# Patient Record
Sex: Male | Born: 1975 | Race: White | Hispanic: No | Marital: Single | Smoking: Former smoker
Health system: Southern US, Community
[De-identification: ages and names within clinical notes are randomized; demographics above are authoritative.]

---

## 2017-07-25 ENCOUNTER — Other Ambulatory Visit: Payer: Self-pay

## 2017-07-25 ENCOUNTER — Emergency Department (HOSPITAL_COMMUNITY): Payer: Self-pay

## 2017-07-25 ENCOUNTER — Emergency Department (HOSPITAL_COMMUNITY)
Admission: EM | Admit: 2017-07-25 | Discharge: 2017-07-26 | Disposition: A | Discharge status: Home / Self Care | Payer: Self-pay | Attending: Emergency Medicine | Admitting: Emergency Medicine

## 2017-07-25 DIAGNOSIS — R079 Chest pain, unspecified: Secondary | ICD-10-CM | POA: Insufficient documentation

## 2017-07-25 DIAGNOSIS — Z5321 Procedure and treatment not carried out due to patient leaving prior to being seen by health care provider: Secondary | ICD-10-CM | POA: Insufficient documentation

## 2017-07-25 LAB — BASIC METABOLIC PANEL
Anion gap: 9 (ref 5–15)
BUN: 15 mg/dL (ref 6–20)
CHLORIDE: 107 mmol/L (ref 98–111)
CO2: 23 mmol/L (ref 22–32)
Calcium: 9.1 mg/dL (ref 8.9–10.3)
Creatinine, Ser: 1.26 mg/dL — ABNORMAL HIGH (ref 0.61–1.24)
GFR calc Af Amer: >60 mL/min (ref >60)
GFR calc non Af Amer: >60 mL/min (ref >60)
GLUCOSE: 99 mg/dL (ref 70–99)
POTASSIUM: 3.7 mmol/L (ref 3.5–5.1)
Sodium: 139 mmol/L (ref 135–145)

## 2017-07-25 LAB — CBC
HEMATOCRIT: 45 % (ref 39.0–52.0)
Hemoglobin: 15.3 g/dL (ref 13.0–17.0)
MCH: 30.1 pg (ref 26.0–34.0)
MCHC: 34 g/dL (ref 30.0–36.0)
MCV: 88.6 fL (ref 78.0–100.0)
Platelets: 313 10*3/uL (ref 150–400)
RBC: 5.08 MIL/uL (ref 4.22–5.81)
RDW: 12.1 % (ref 11.5–15.5)
WBC: 8.7 10*3/uL (ref 4.0–10.5)

## 2017-07-25 LAB — I-STAT TROPONIN, ED: Troponin i, poc: 0 ng/mL (ref 0.00–0.08)

## 2017-07-25 NOTE — ED Triage Notes (Signed)
Patient c/o CP and SOB. Has been ongoing for a while, worsening today-  states that if feels like palpitations. Patient is hyperventilating intermittently and appears anxious.

## 2017-07-26 ENCOUNTER — Other Ambulatory Visit: Payer: Self-pay

## 2017-07-26 ENCOUNTER — Emergency Department (HOSPITAL_COMMUNITY)
Admission: EM | Admit: 2017-07-26 | Discharge: 2017-07-27 | Disposition: A | Discharge status: Home / Self Care | Payer: Self-pay | Attending: Emergency Medicine | Admitting: Emergency Medicine

## 2017-07-26 ENCOUNTER — Encounter (HOSPITAL_COMMUNITY): Payer: Self-pay | Admitting: Emergency Medicine

## 2017-07-26 ENCOUNTER — Emergency Department (HOSPITAL_COMMUNITY): Payer: Self-pay

## 2017-07-26 DIAGNOSIS — R002 Palpitations: Secondary | ICD-10-CM | POA: Insufficient documentation

## 2017-07-26 DIAGNOSIS — Z87891 Personal history of nicotine dependence: Secondary | ICD-10-CM | POA: Insufficient documentation

## 2017-07-26 NOTE — ED Notes (Signed)
PT did not respond when called for vitals X1

## 2017-07-26 NOTE — ED Notes (Signed)
Pt did not respond when called X2

## 2017-07-26 NOTE — ED Notes (Signed)
Patient was reported leaving by others in the lobby.

## 2017-07-26 NOTE — ED Notes (Signed)
Follow up call made  Pt states he is going to return to ed  07/26/17/1505  s Kaidyn Javid rn

## 2017-07-26 NOTE — ED Triage Notes (Signed)
Pt reports heart palpitations that started yesterday accompanied with chest heaviness. Pt denies pain.

## 2017-07-27 LAB — CBC
HCT: 44.9 % (ref 39.0–52.0)
HEMOGLOBIN: 14.9 g/dL (ref 13.0–17.0)
MCH: 29.9 pg (ref 26.0–34.0)
MCHC: 33.2 g/dL (ref 30.0–36.0)
MCV: 90.2 fL (ref 78.0–100.0)
PLATELETS: 286 10*3/uL (ref 150–400)
RBC: 4.98 MIL/uL (ref 4.22–5.81)
RDW: 12.4 % (ref 11.5–15.5)
WBC: 9.1 10*3/uL (ref 4.0–10.5)

## 2017-07-27 LAB — BASIC METABOLIC PANEL
Anion gap: 8 (ref 5–15)
BUN: 16 mg/dL (ref 6–20)
CALCIUM: 9.4 mg/dL (ref 8.9–10.3)
CO2: 27 mmol/L (ref 22–32)
CREATININE: 1.24 mg/dL (ref 0.61–1.24)
Chloride: 107 mmol/L (ref 98–111)
GFR calc Af Amer: >60 mL/min (ref >60)
GFR calc non Af Amer: >60 mL/min (ref >60)
GLUCOSE: 89 mg/dL (ref 70–99)
Potassium: 3.9 mmol/L (ref 3.5–5.1)
Sodium: 142 mmol/L (ref 135–145)

## 2017-07-27 LAB — I-STAT TROPONIN, ED: Troponin i, poc: 0 ng/mL (ref 0.00–0.08)

## 2017-07-27 NOTE — Discharge Instructions (Addendum)
Follow up with [facility] for consideration of a 24-hour monitor to further evaluate your symptoms of skipping beats. It is also encouraged to find a Primary care provider for routine health. Use the Resource list for a primary care if this is helpful. Return to the emergency department if you experience worsening symptoms or new concerns.

## 2017-07-27 NOTE — ED Provider Notes (Signed)
[facility] Provider Note   CSN: [id number] Arrival date & time: 07/26/17  2317     History   Chief Complaint Chief Complaint  Patient presents with  . Palpitations    HPI Victor Villa is a 42 y.o. male.  Patient presents with complaint of palpitations that are described as skipping beats. He states it feels as though his heart skips and then feels a rush of adrenaline that makes him nervous. He states he has had these infrequently for years that have become significantly more frequent over the last several days. He denies chest pain, sense of heart racing, diaphoresis, SOB, fever, cough. He feels that it sometimes sends him into a full blown panic attack. He reports being under significant stress.   The history is provided by the patient. No language interpreter was used.  Palpitations   Pertinent negatives include no fever, no chest pain and no shortness of breath.    History reviewed. No pertinent past medical history.  There are no active problems to display for this patient.   History reviewed. No pertinent surgical history.      Home Medications    Prior to Admission medications   Not on File    Family History No family history on file.  Social History Social History   Tobacco Use  . Smoking status: Former Smoker    Types: Cigarettes    Last attempt to quit: 02/26/2017    Years since quitting: 0.4  . Smokeless tobacco: Never Used  Substance Use Topics  . Alcohol use: Never    Frequency: Never  . Drug use: Never     Allergies   Patient has no known allergies.   Review of Systems Review of Systems  Constitutional: Negative for chills and fever.  HENT: Negative.   Respiratory: Negative.  Negative for shortness of breath.   Cardiovascular: Positive for palpitations. Negative for chest pain.  Gastrointestinal: Negative.   Musculoskeletal: Negative.  Negative for myalgias.  Skin: Negative.     Neurological: Negative.   Psychiatric/Behavioral: The patient is nervous/anxious.      Physical Exam Updated Vital Signs BP (!) 107/57   Pulse 64   Temp 97.8 F (36.6 C) (Oral)   Resp (!) 21   Ht 6\' 2"  (1.88 m)   Wt 99.8 kg (220 lb)   SpO2 100%   BMI 28.25 kg/m   Physical Exam  Constitutional: He is oriented to person, place, and time. He appears well-developed and well-nourished.  HENT:  Head: Normocephalic.  Neck: Normal range of motion. Neck supple. Carotid bruit is not present.  Cardiovascular: Normal rate and regular rhythm.  No murmur heard. Pulmonary/Chest: Effort normal and breath sounds normal. He has no wheezes. He has no rales.  Abdominal: Soft. Bowel sounds are normal. There is no tenderness. There is no rebound and no guarding.  Musculoskeletal: Normal range of motion. He exhibits no edema.  Neurological: He is alert and oriented to person, place, and time.  Skin: Skin is warm and dry. No rash noted.  Psychiatric: He has a normal mood and affect.  Nursing note and vitals reviewed.    ED Treatments / Results  Labs (all labs ordered are listed, but only abnormal results are displayed) Labs Reviewed  BASIC METABOLIC PANEL  CBC  I-STAT TROPONIN, ED    EKG None  Radiology Dg Chest 2 View  Result Date: 07/25/2017 CLINICAL DATA:  Patient c/o CP and SOB. Has been ongoing for a  while, worsening today- states that if feels like palpitations. Patient is hyperventilating intermittently and appears anxious. EXAM: CHEST - 2 VIEW COMPARISON:  None. FINDINGS: The heart size and mediastinal contours are within normal limits. Both lungs are clear. The visualized skeletal structures are unremarkable. IMPRESSION: No active cardiopulmonary disease. Electronically Signed   By: Gaylyn Rong M.D.   On: 07/25/2017 23:33    Procedures Procedures (including critical care time)  Medications Ordered in ED Medications - No data to display   Initial Impression /  Assessment and Plan / ED Course  I have reviewed the triage vital signs and the nursing notes.  Pertinent labs & imaging results that were available during my care of the patient were reviewed by me and considered in my medical decision making (see chart for details).     Patient is here for frequent palpitations described as skipping beats, which causes anxiety and sometimes panic. Labs, CXR, EKG all unremarkable.   There are infrequent PVC's on the monitor that coincide with when patient feels symptoms.   VSS. He is well appearing. No other medical history. Will refer to cardiology for consideration of holter monitor. Strongly encouraged establishing with a PCP for routine health.  Final Clinical Impressions(s) / ED Diagnoses   Final diagnoses:  None   1. palpitations  ED Discharge Orders    None       Elpidio Anis, PA-C 07/27/17 0258    Gilda Crease, MD 07/28/17 [phone]

## 2019-08-07 IMAGING — DX DG CHEST 2V
2 series · 2 of 2 positions shown · non-contrast
Comparison: None.

CLINICAL DATA: Patient c/o CP and SOB. Has been ongoing for a
while, worsening today- states that if feels like palpitations.
Patient is hyperventilating intermittently and appears anxious.

EXAM:
CHEST - 2 VIEW

[w chest pa]
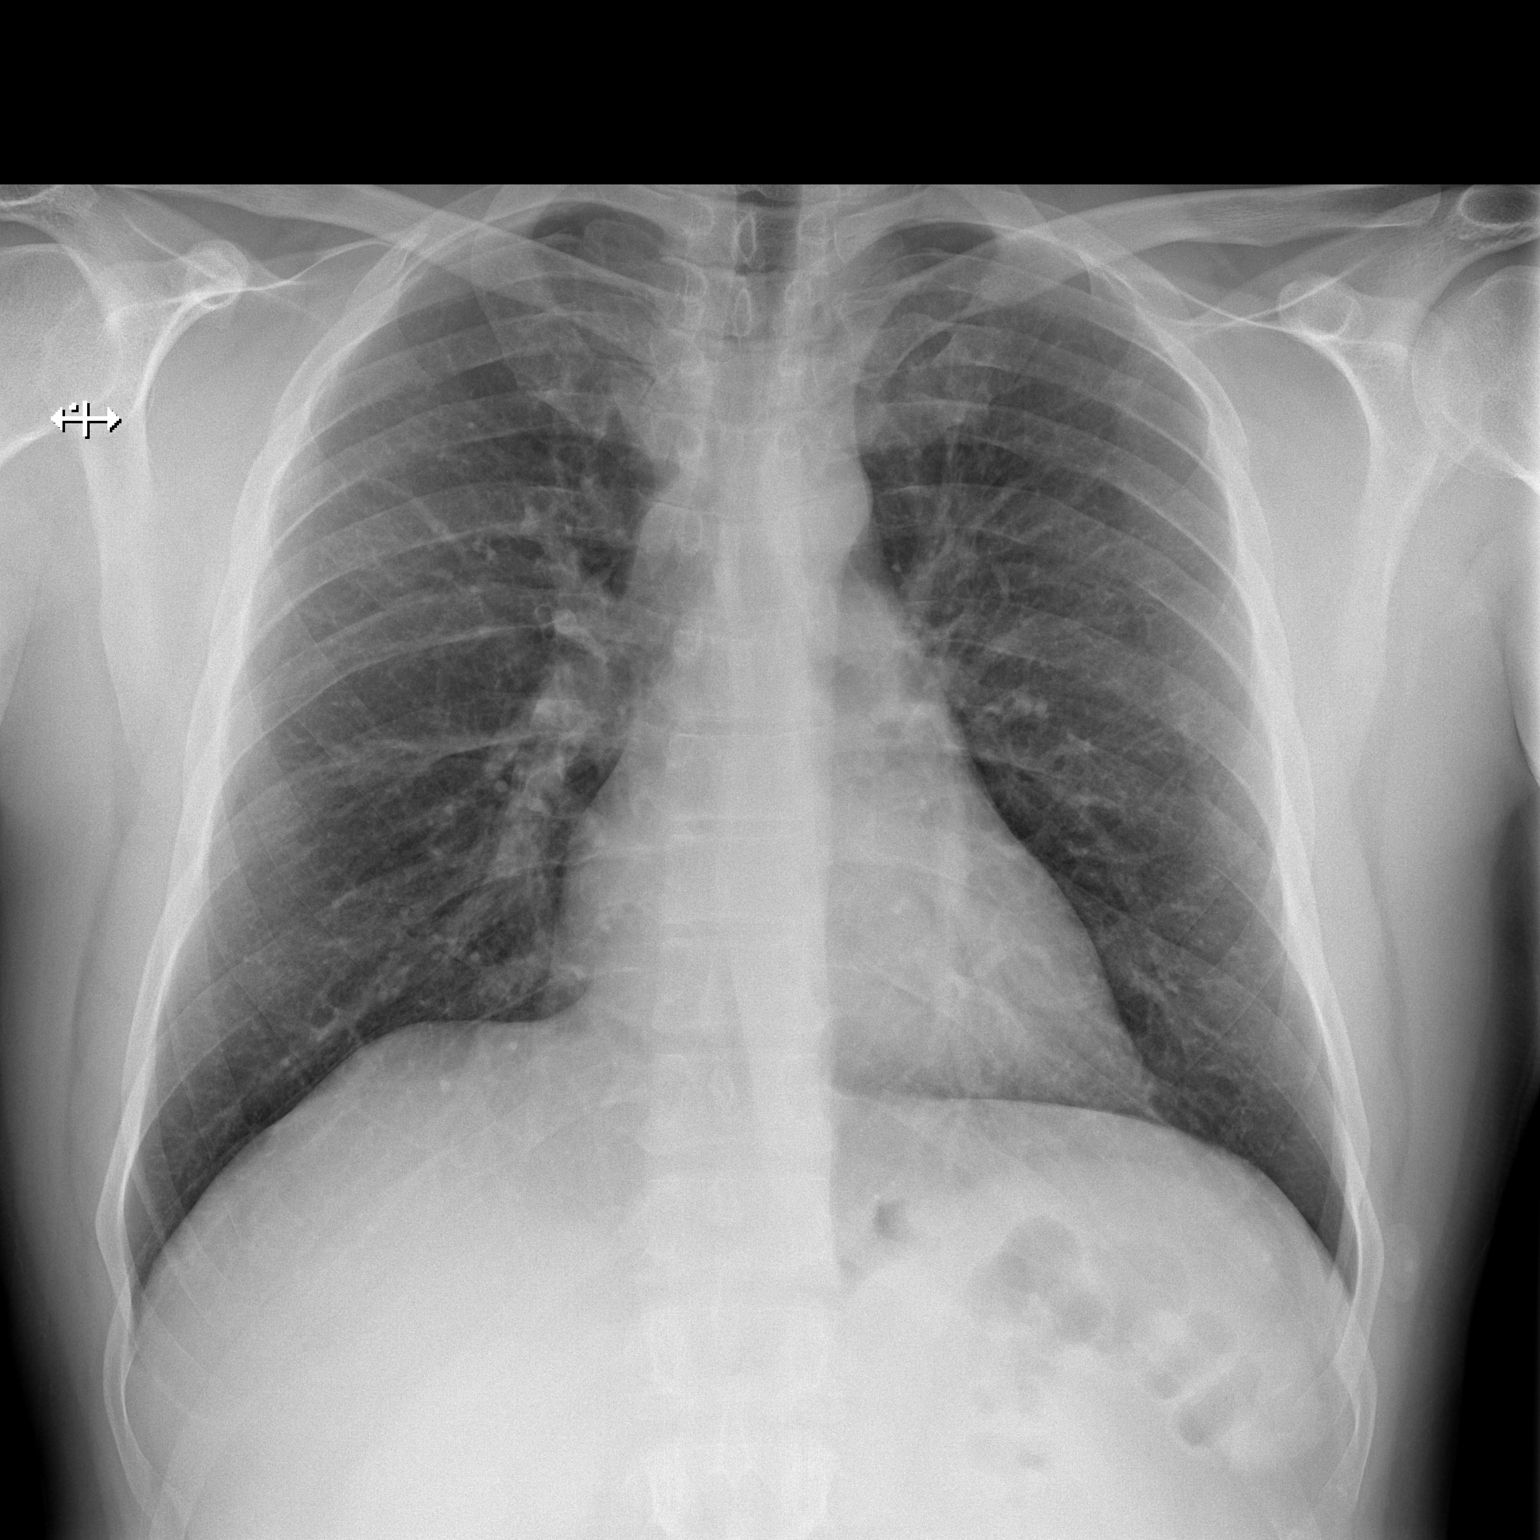

[w chest decub]
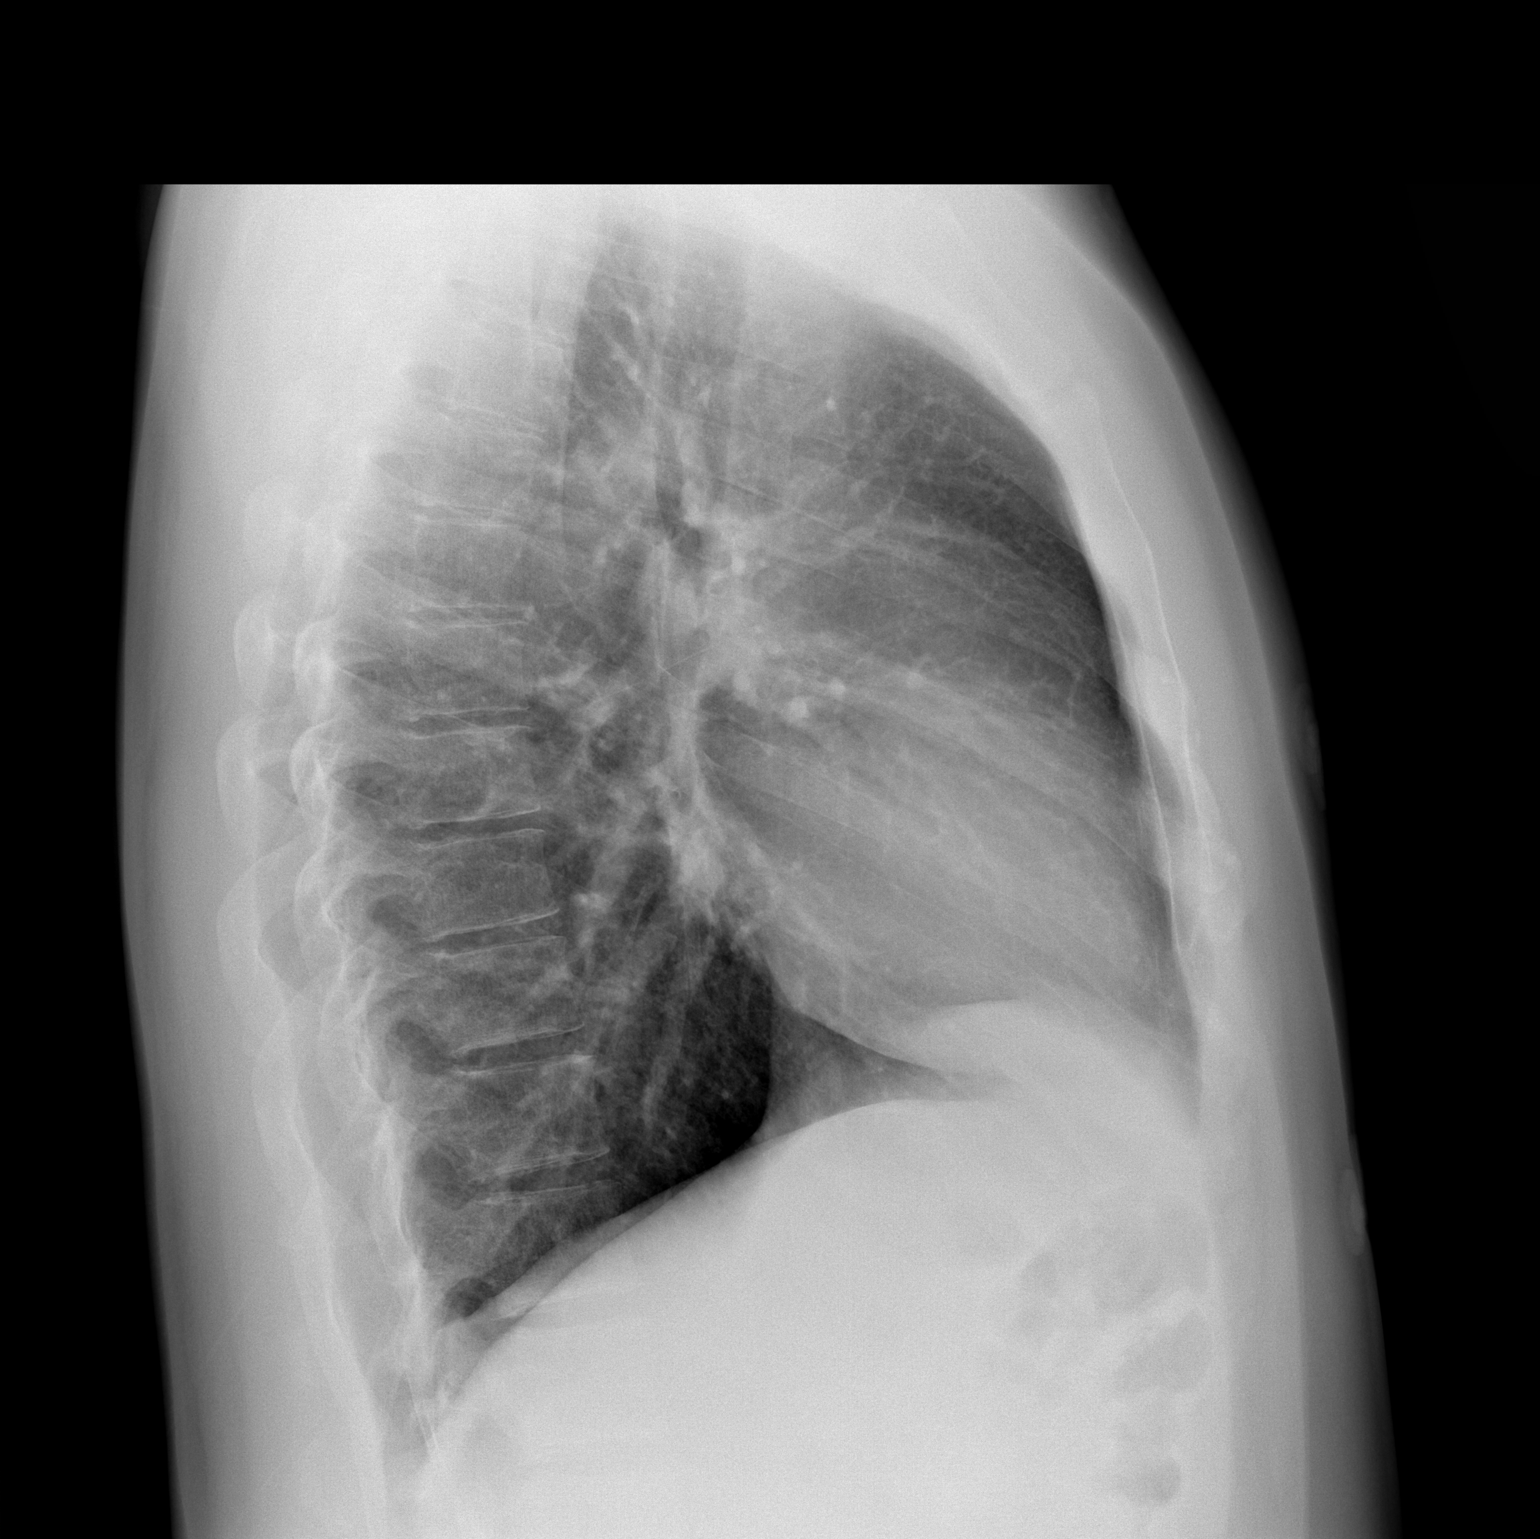

[2 of 2 positions shown; findings below may reference images not displayed]

FINDINGS: The heart size and mediastinal contours are within normal limits.
Both lungs are clear. The visualized skeletal structures are
unremarkable.
IMPRESSION: No active cardiopulmonary disease.
